# Patient Record
Sex: Male | Born: 1965 | Race: Black or African American | Hispanic: No | Marital: Single | Smoking: Never smoker
Health system: Southern US, Community
[De-identification: ages and names within clinical notes are randomized; demographics above are authoritative.]

## PROBLEM LIST (undated history)

## (undated) DIAGNOSIS — E119 Type 2 diabetes mellitus without complications: Secondary | ICD-10-CM

## (undated) DIAGNOSIS — I10 Essential (primary) hypertension: Secondary | ICD-10-CM

## (undated) DIAGNOSIS — M549 Dorsalgia, unspecified: Secondary | ICD-10-CM

## (undated) HISTORY — PX: ROTATOR CUFF REPAIR: SHX139

## (undated) HISTORY — PX: MANDIBLE FRACTURE SURGERY: SHX706

---

## 2001-07-14 ENCOUNTER — Emergency Department (HOSPITAL_COMMUNITY): Admission: EM | Admit: 2001-07-14 | Discharge: 2001-07-14 | Payer: Self-pay | Admitting: *Deleted

## 2001-07-14 ENCOUNTER — Encounter: Payer: Self-pay | Admitting: *Deleted

## 2001-09-05 ENCOUNTER — Emergency Department (HOSPITAL_COMMUNITY): Admission: EM | Admit: 2001-09-05 | Discharge: 2001-09-05 | Payer: Self-pay | Admitting: Emergency Medicine

## 2001-10-02 ENCOUNTER — Emergency Department (HOSPITAL_COMMUNITY): Admission: EM | Admit: 2001-10-02 | Discharge: 2001-10-02 | Payer: Self-pay

## 2001-10-10 ENCOUNTER — Emergency Department (HOSPITAL_COMMUNITY): Admission: EM | Admit: 2001-10-10 | Discharge: 2001-10-10 | Payer: Self-pay | Admitting: *Deleted

## 2002-06-05 ENCOUNTER — Emergency Department (HOSPITAL_COMMUNITY): Admission: EM | Admit: 2002-06-05 | Discharge: 2002-06-05 | Payer: Self-pay

## 2002-07-06 ENCOUNTER — Encounter: Payer: Self-pay | Admitting: Emergency Medicine

## 2002-07-06 ENCOUNTER — Emergency Department (HOSPITAL_COMMUNITY): Admission: EM | Admit: 2002-07-06 | Discharge: 2002-07-06 | Payer: Self-pay | Admitting: Emergency Medicine

## 2002-07-17 ENCOUNTER — Emergency Department (HOSPITAL_COMMUNITY): Admission: EM | Admit: 2002-07-17 | Discharge: 2002-07-17 | Payer: Self-pay | Admitting: Emergency Medicine

## 2002-07-17 ENCOUNTER — Encounter: Payer: Self-pay | Admitting: Emergency Medicine

## 2002-11-01 ENCOUNTER — Ambulatory Visit (HOSPITAL_COMMUNITY): Admission: RE | Admit: 2002-11-01 | Discharge: 2002-11-01 | Payer: Self-pay | Admitting: Internal Medicine

## 2002-11-01 ENCOUNTER — Encounter: Payer: Self-pay | Admitting: Internal Medicine

## 2002-11-23 ENCOUNTER — Emergency Department (HOSPITAL_COMMUNITY): Admission: EM | Admit: 2002-11-23 | Discharge: 2002-11-23 | Payer: Self-pay | Admitting: Emergency Medicine

## 2002-11-23 ENCOUNTER — Encounter: Payer: Self-pay | Admitting: Emergency Medicine

## 2003-03-09 ENCOUNTER — Emergency Department (HOSPITAL_COMMUNITY): Admission: EM | Admit: 2003-03-09 | Discharge: 2003-03-09 | Payer: Self-pay | Admitting: Emergency Medicine

## 2003-05-17 ENCOUNTER — Emergency Department (HOSPITAL_COMMUNITY): Admission: EM | Admit: 2003-05-17 | Discharge: 2003-05-18 | Payer: Self-pay | Admitting: Emergency Medicine

## 2003-05-24 ENCOUNTER — Ambulatory Visit (HOSPITAL_BASED_OUTPATIENT_CLINIC_OR_DEPARTMENT_OTHER): Admission: RE | Admit: 2003-05-24 | Discharge: 2003-05-24 | Payer: Self-pay | Admitting: Otolaryngology

## 2003-05-24 ENCOUNTER — Encounter (INDEPENDENT_AMBULATORY_CARE_PROVIDER_SITE_OTHER): Payer: Self-pay | Admitting: Specialist

## 2003-05-24 ENCOUNTER — Ambulatory Visit (HOSPITAL_COMMUNITY): Admission: RE | Admit: 2003-05-24 | Discharge: 2003-05-24 | Payer: Self-pay | Admitting: Otolaryngology

## 2003-11-27 ENCOUNTER — Emergency Department (HOSPITAL_COMMUNITY): Admission: EM | Admit: 2003-11-27 | Discharge: 2003-11-27 | Payer: Self-pay | Admitting: Emergency Medicine

## 2004-07-14 ENCOUNTER — Emergency Department (HOSPITAL_COMMUNITY): Admission: EM | Admit: 2004-07-14 | Discharge: 2004-07-14 | Payer: Self-pay | Admitting: Family Medicine

## 2005-06-05 ENCOUNTER — Emergency Department (HOSPITAL_COMMUNITY): Admission: EM | Admit: 2005-06-05 | Discharge: 2005-06-05 | Payer: Self-pay | Admitting: Family Medicine

## 2005-06-10 ENCOUNTER — Emergency Department (HOSPITAL_COMMUNITY): Admission: EM | Admit: 2005-06-10 | Discharge: 2005-06-10 | Payer: Self-pay | Admitting: Family Medicine

## 2005-09-30 ENCOUNTER — Emergency Department (HOSPITAL_COMMUNITY): Admission: EM | Admit: 2005-09-30 | Discharge: 2005-09-30 | Payer: Self-pay | Admitting: Family Medicine

## 2005-11-09 ENCOUNTER — Emergency Department (HOSPITAL_COMMUNITY): Admission: EM | Admit: 2005-11-09 | Discharge: 2005-11-10 | Payer: Self-pay | Admitting: Emergency Medicine

## 2005-11-18 ENCOUNTER — Emergency Department (HOSPITAL_COMMUNITY): Admission: EM | Admit: 2005-11-18 | Discharge: 2005-11-18 | Payer: Self-pay | Admitting: Emergency Medicine

## 2007-09-26 ENCOUNTER — Emergency Department (HOSPITAL_COMMUNITY): Admission: EM | Admit: 2007-09-26 | Discharge: 2007-09-26 | Payer: Self-pay | Admitting: Emergency Medicine

## 2007-10-11 ENCOUNTER — Emergency Department (HOSPITAL_COMMUNITY): Admission: EM | Admit: 2007-10-11 | Discharge: 2007-10-11 | Payer: Self-pay | Admitting: Emergency Medicine

## 2008-01-17 ENCOUNTER — Emergency Department (HOSPITAL_COMMUNITY): Admission: EM | Admit: 2008-01-17 | Discharge: 2008-01-17 | Payer: Self-pay | Admitting: Emergency Medicine

## 2008-10-12 ENCOUNTER — Encounter: Admission: RE | Admit: 2008-10-12 | Discharge: 2008-10-12 | Payer: Self-pay | Admitting: Occupational Medicine

## 2009-06-27 ENCOUNTER — Emergency Department (HOSPITAL_COMMUNITY): Admission: EM | Admit: 2009-06-27 | Discharge: 2009-06-27 | Payer: Self-pay | Admitting: Emergency Medicine

## 2010-01-07 ENCOUNTER — Emergency Department (HOSPITAL_COMMUNITY): Admission: EM | Admit: 2010-01-07 | Discharge: 2010-01-07 | Payer: Self-pay | Admitting: Family Medicine

## 2010-08-04 ENCOUNTER — Emergency Department (HOSPITAL_COMMUNITY)
Admission: EM | Admit: 2010-08-04 | Discharge: 2010-08-04 | Payer: Self-pay | Source: Home / Self Care | Admitting: Emergency Medicine

## 2010-08-07 ENCOUNTER — Other Ambulatory Visit: Payer: Self-pay | Admitting: Emergency Medicine

## 2010-08-07 ENCOUNTER — Inpatient Hospital Stay (INDEPENDENT_AMBULATORY_CARE_PROVIDER_SITE_OTHER)
Admission: RE | Admit: 2010-08-07 | Discharge: 2010-08-07 | Payer: Self-pay | Source: Ambulatory Visit | Attending: Emergency Medicine | Admitting: Emergency Medicine

## 2010-08-07 ENCOUNTER — Other Ambulatory Visit: Payer: Self-pay

## 2010-08-07 DIAGNOSIS — L03119 Cellulitis of unspecified part of limb: Secondary | ICD-10-CM

## 2010-08-09 LAB — CULTURE, ROUTINE-ABSCESS: Gram Stain: NONE SEEN

## 2010-10-22 IMAGING — CR DG WRIST COMPLETE 3+V*L*
4 series · 4 of 4 positions shown · non-contrast
Comparison: None.

CLINICAL DATA: 43-year-old male status post left wrist twisting
injury.  Lateral pain.

LEFT WRIST - COMPLETE 3+ VIEW

[view not recorded (1 of 4)]
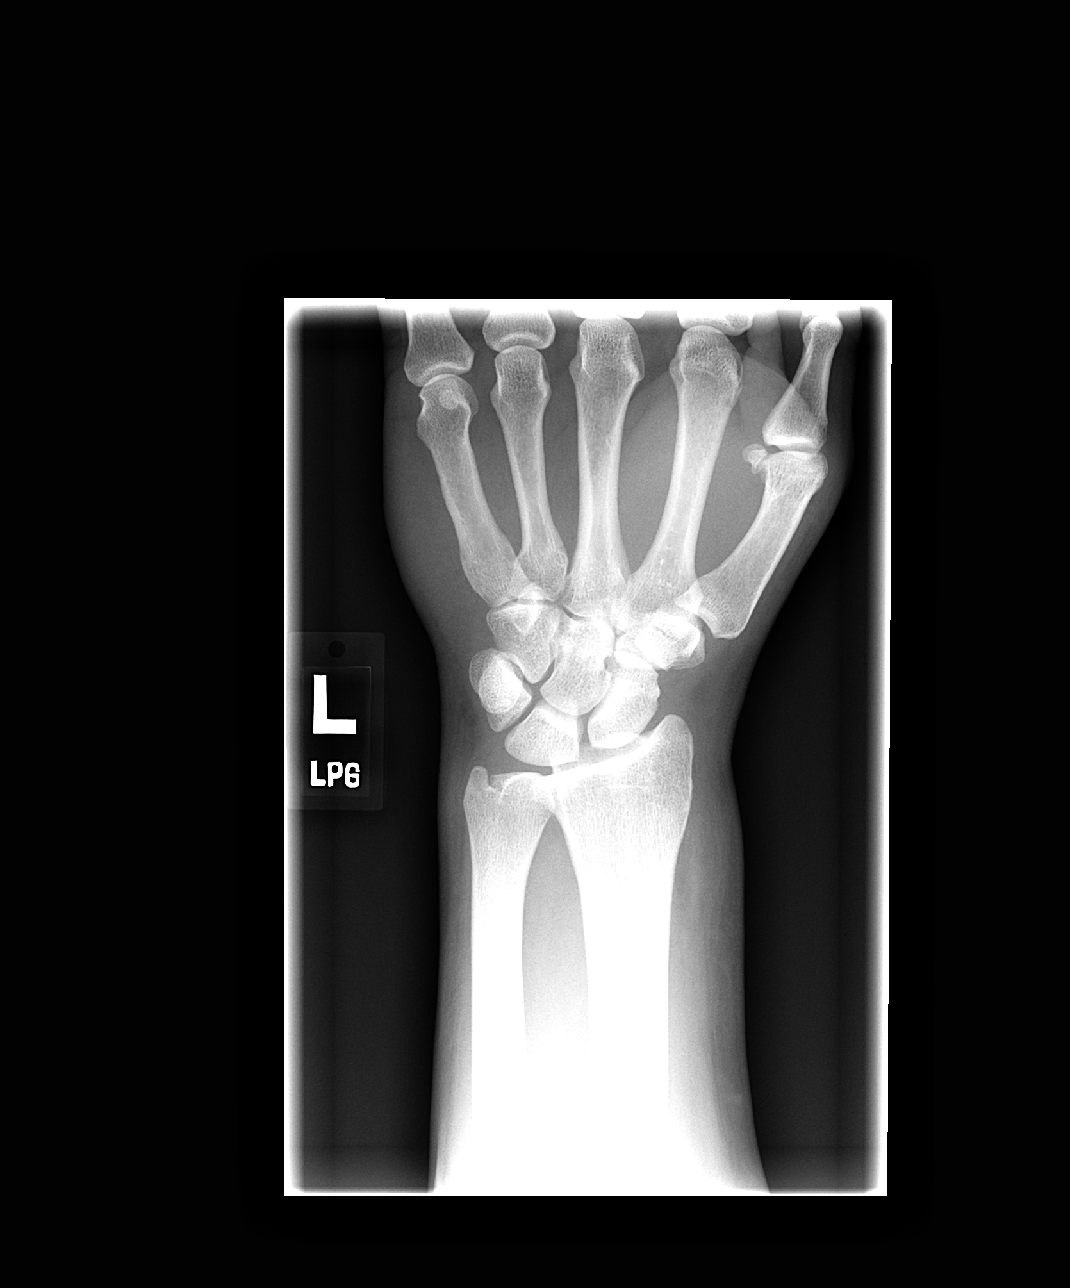

[view not recorded (2 of 4)]
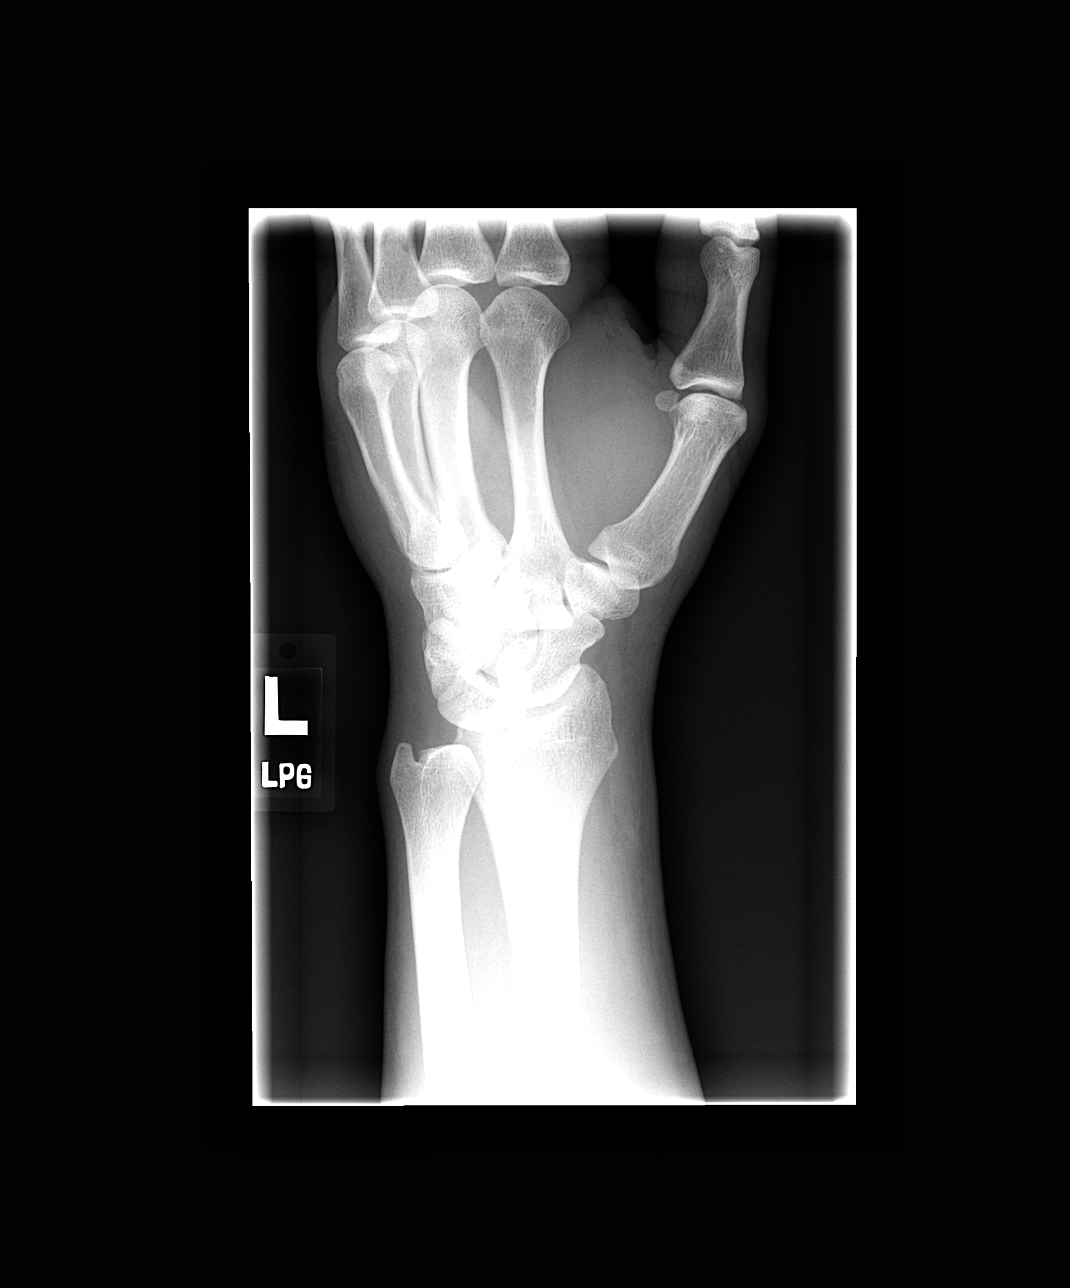

[view not recorded (3 of 4)]
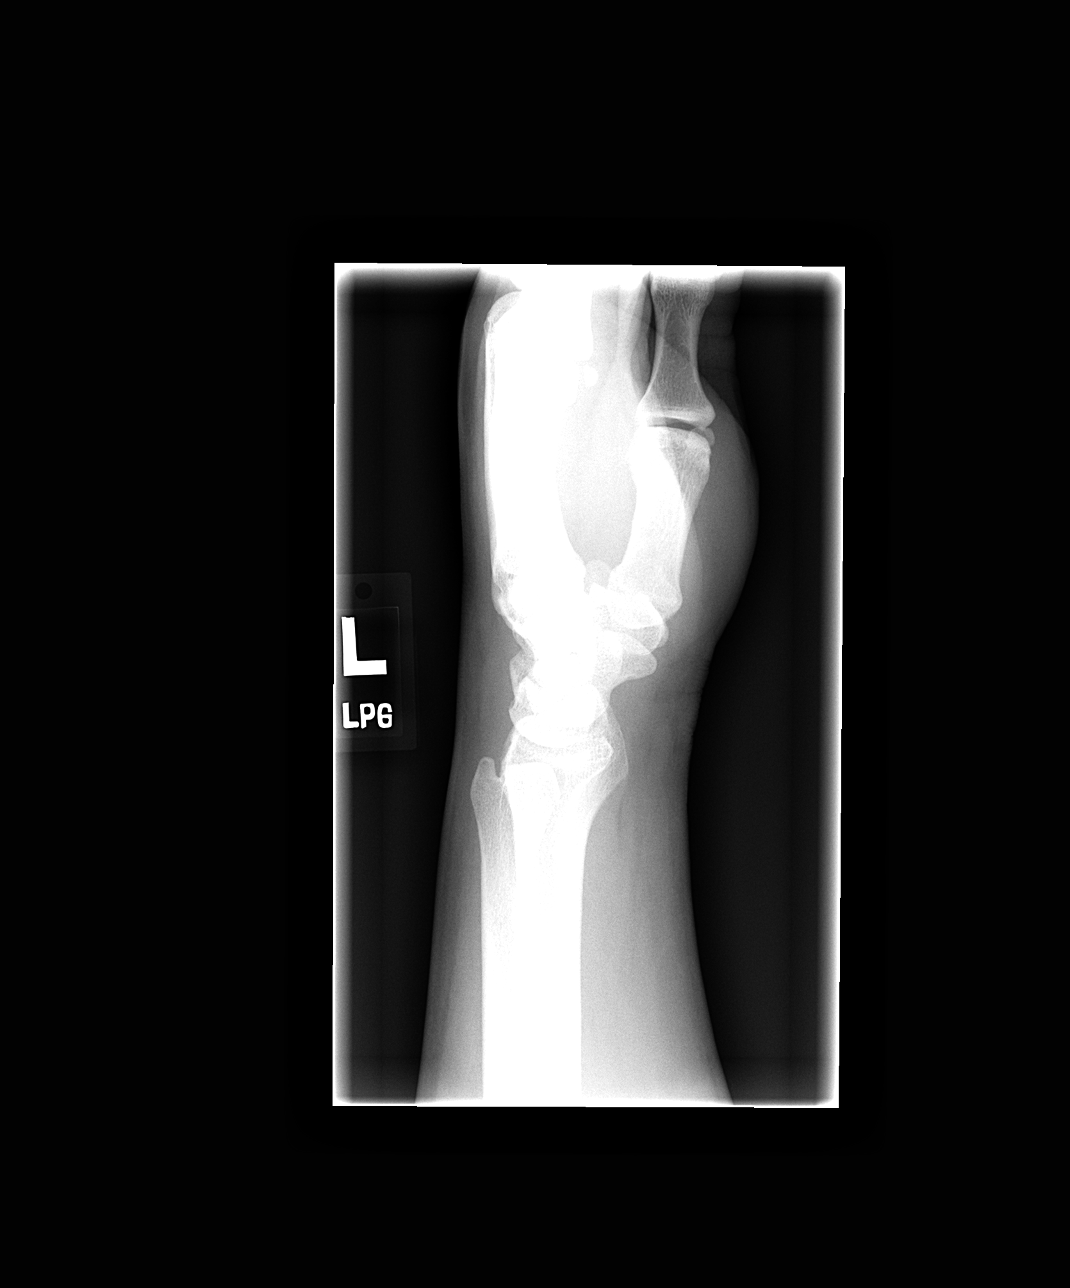

[view not recorded (4 of 4)]
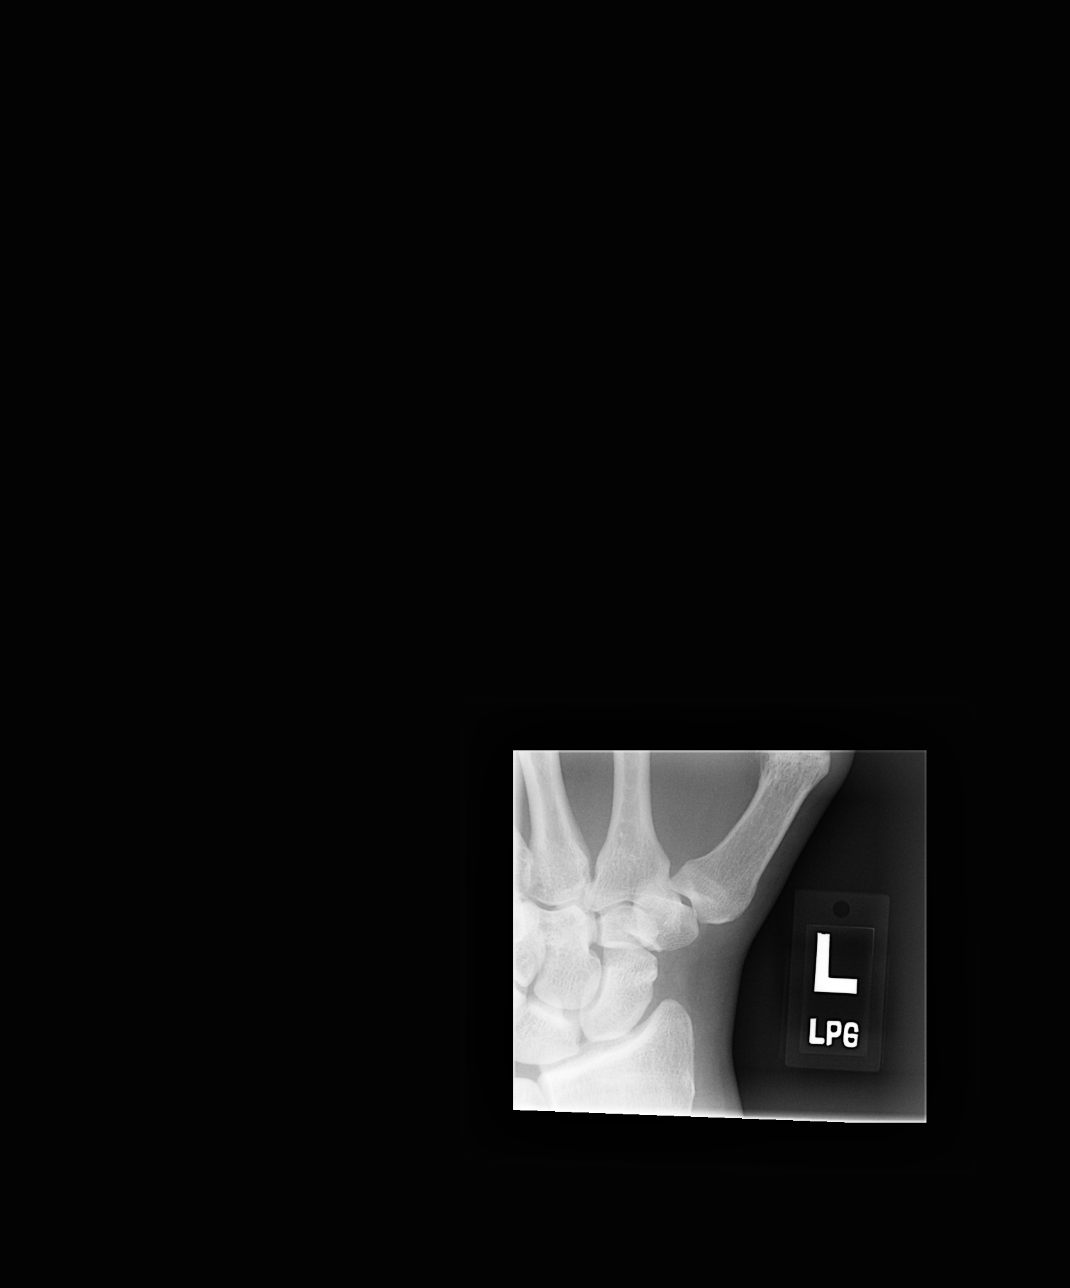

[4 of 4 positions shown; findings below may reference images not displayed]

FINDINGS: Bone mineralization is within normal limits.  Carpal bone
alignment within normal limits.  Distal left radius and ulna are
intact.  Joint spaces are within normal limits.  Scaphoid and other
carpal bones appear intact.  Visualized osseous structures of the
left hand appear intact.
IMPRESSION: No acute fracture or dislocation identified about the left wrist.

## 2010-11-21 NOTE — Op Note (Signed)
NAME:  CUTLER, SUNDAY                         ACCOUNT NO.:  [id number]   MEDICAL RECORD NO.:  [id number]                   PATIENT TYPE:  AMB   LOCATION:  DSC                                  FACILITY:  [facility]   PHYSICIAN:  Suzanna Obey, M.D.                    DATE OF BIRTH:  [birth year]   DATE OF PROCEDURE:  05/24/2003  DATE OF DISCHARGE:                                 OPERATIVE REPORT   PREOPERATIVE DIAGNOSIS:  Submental mass.   POSTOPERATIVE DIAGNOSIS:  Submental mass.   OPERATION PERFORMED:  Excision of submental mass.   SURGEON:  Suzanna Obey, M.D.   ANESTHESIA:  General endotracheal tube.   ESTIMATED BLOOD LOSS:  Less than 5 mL.   INDICATIONS FOR PROCEDURE:  This is a 45 year old who has had a mass in his  submental region for many months.  It has not gone away with antibiotic  therapy.  He feels like it is slightly enlarged.  He was informed of the  risks and benefits of the procedure including bleeding, infection, scar,  numbness of the area, risks of the facial nerve and risks of the anesthetic.  All questions were answered and consent was obtained.   DESCRIPTION OF PROCEDURE:  The patient was taken to the operating room and  placed in supine position.  After adequate LMA anesthesia, he was prepped  and draped in the usual sterile manner.  The incision was made with a 15  blade after injecting with 1% lidocaine with 1:100,000 epinephrine.  A skin  crease was followed.  The dissection was carried down with the  electrocautery and the mass was identified.  There were several nodules that  were dissected around with the electrocautery.  This was carried down to the  muscle, digastric mylohyoid area.  The mass was removed and hemostasis was  achieved with the electrocautery and clamping and dividing vessel.  The  wound was copiously irrigated.  The masses were sent for fresh pathology.  The wound was closed with interrupted 4-0 chromic and a running 5-0 nylon.  The patient  was awakened and brought to recovery in stable condition, counts  correct.                                               Suzanna Obey, M.D.    Cordelia Pen  D:  05/24/2003  T:  05/25/2003  Job:  [id number]   cc:   [facility]

## 2012-01-17 IMAGING — CR DG TOE GREAT 2+V*R*
3 series · 3 of 3 positions shown · non-contrast
Comparison: None

CLINICAL DATA: Injured right great toe.

RIGHT TOE - 2+ VIEW

[view not recorded (1 of 3)]
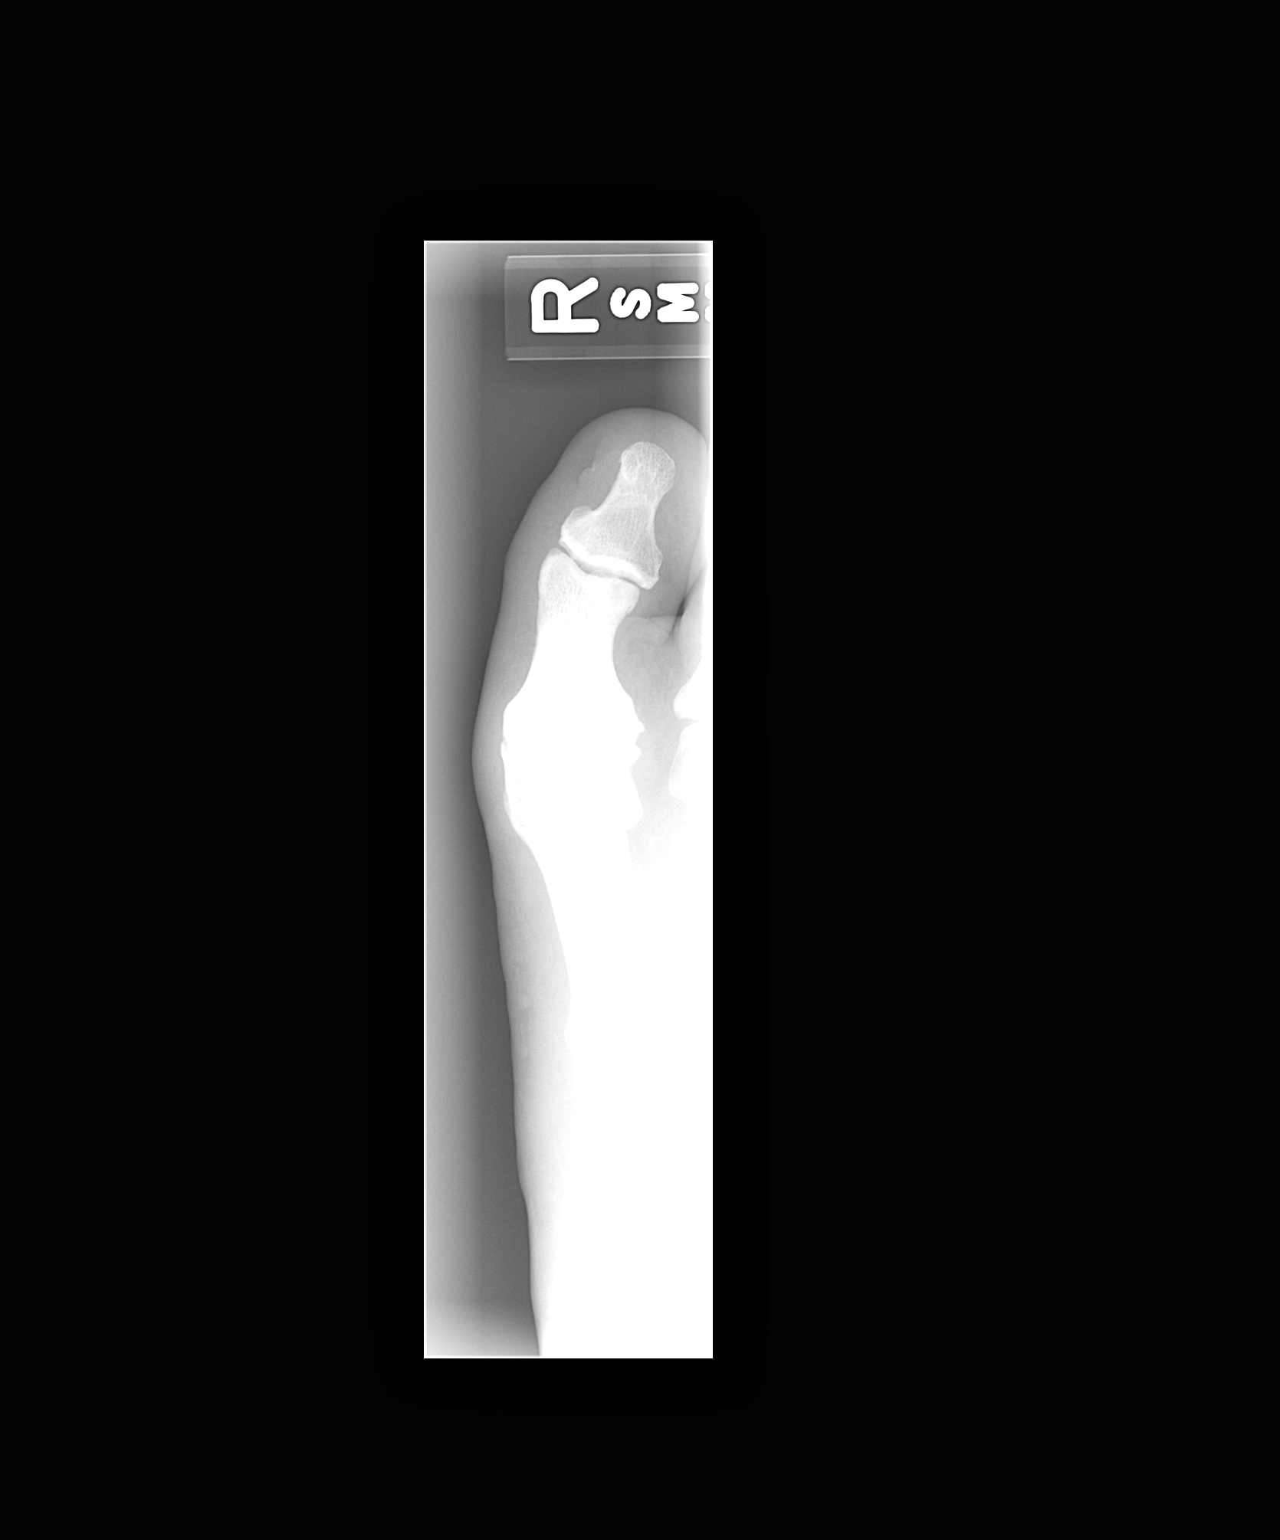

[view not recorded (2 of 3)]
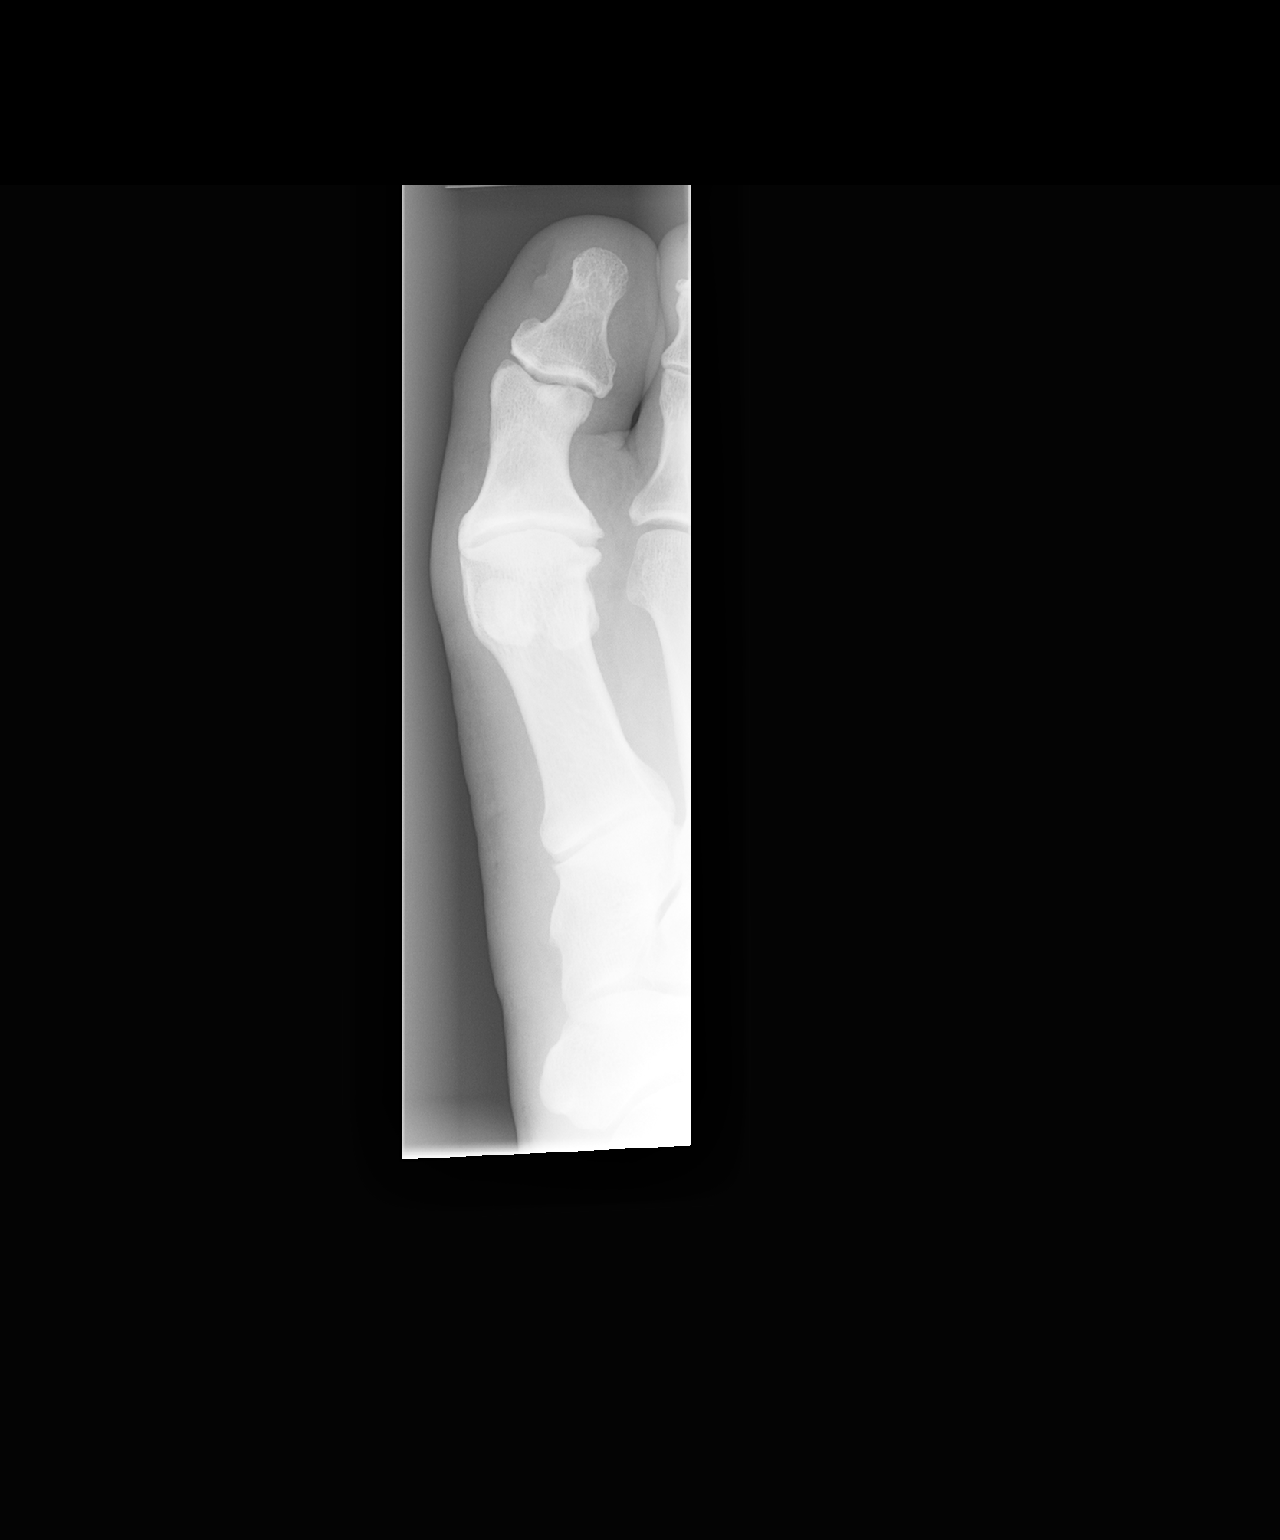

[view not recorded (3 of 3)]
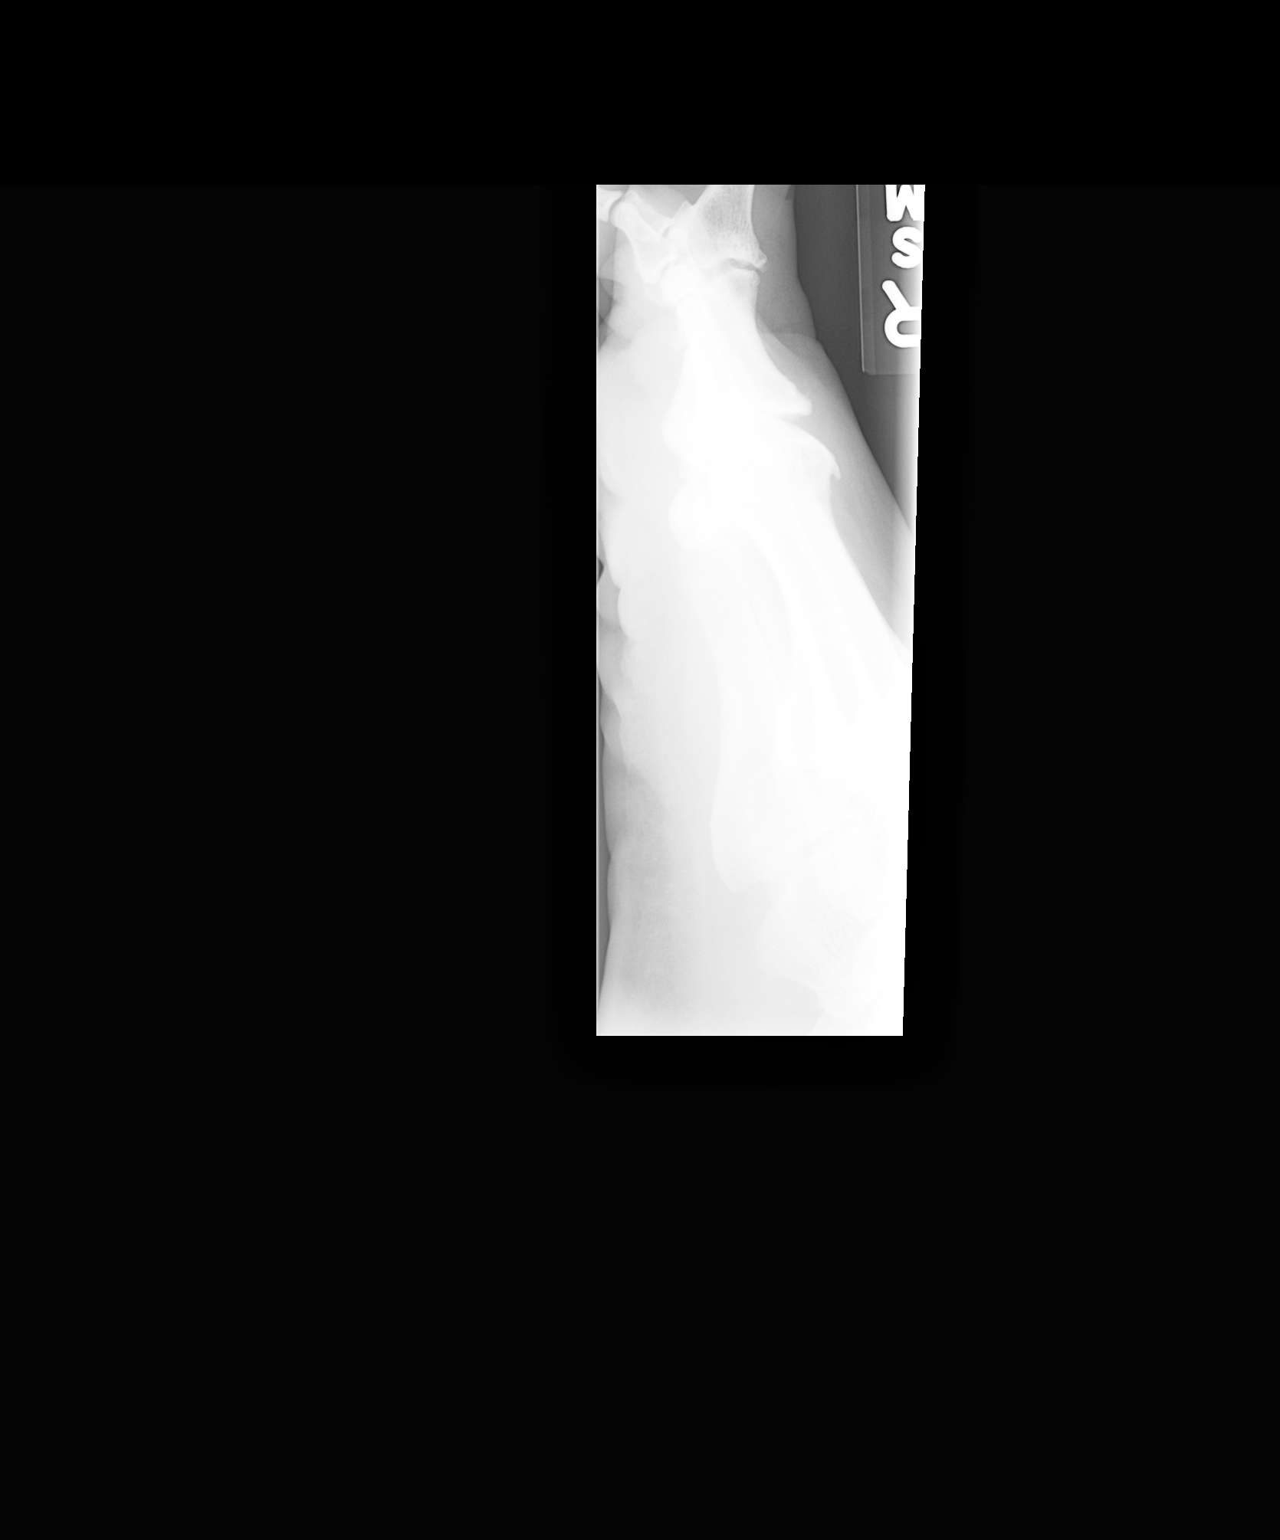

[3 of 3 positions shown; findings below may reference images not displayed]

FINDINGS: A hallux valgus deformity is noted.  Mild degenerative
changes at the metatarsal phalangeal joint.  No acute fracture.
IMPRESSION: No acute bony findings.

## 2014-02-07 ENCOUNTER — Encounter (HOSPITAL_COMMUNITY): Payer: Self-pay | Admitting: Emergency Medicine

## 2014-02-07 ENCOUNTER — Emergency Department (HOSPITAL_COMMUNITY)
Admission: EM | Admit: 2014-02-07 | Discharge: 2014-02-07 | Disposition: A | Discharge status: Home / Self Care | Payer: Self-pay | Attending: Emergency Medicine | Admitting: Emergency Medicine

## 2014-02-07 DIAGNOSIS — E119 Type 2 diabetes mellitus without complications: Secondary | ICD-10-CM | POA: Insufficient documentation

## 2014-02-07 DIAGNOSIS — I10 Essential (primary) hypertension: Secondary | ICD-10-CM | POA: Insufficient documentation

## 2014-02-07 DIAGNOSIS — G8929 Other chronic pain: Secondary | ICD-10-CM | POA: Insufficient documentation

## 2014-02-07 DIAGNOSIS — M5430 Sciatica, unspecified side: Secondary | ICD-10-CM | POA: Insufficient documentation

## 2014-02-07 DIAGNOSIS — M5431 Sciatica, right side: Secondary | ICD-10-CM

## 2014-02-07 DIAGNOSIS — M25559 Pain in unspecified hip: Secondary | ICD-10-CM | POA: Insufficient documentation

## 2014-02-07 HISTORY — DX: Essential (primary) hypertension: I10

## 2014-02-07 HISTORY — DX: Type 2 diabetes mellitus without complications: E11.9

## 2014-02-07 HISTORY — DX: Dorsalgia, unspecified: M54.9

## 2014-02-07 LAB — CBG MONITORING, ED: Glucose-Capillary: 314 mg/dL — ABNORMAL HIGH (ref 70–99)

## 2014-02-07 MED ORDER — KETOROLAC TROMETHAMINE 60 MG/2ML IM SOLN
60.0000 mg | Freq: Once | INTRAMUSCULAR | Status: AC
  Administered 2014-02-07: 60 mg via INTRAMUSCULAR
  Filled 2014-02-07: qty 2

## 2014-02-07 MED ORDER — PREDNISONE 20 MG PO TABS: 40.0000 mg | ORAL_TABLET | Freq: Every day | ORAL | Status: AC

## 2014-02-07 MED ORDER — CYCLOBENZAPRINE HCL 10 MG PO TABS: 10.0000 mg | ORAL_TABLET | Freq: Two times a day (BID) | ORAL | Status: AC | PRN

## 2014-02-07 NOTE — ED Notes (Signed)
Patient presents today with a chief complaint of right hip and buttock pain x 2 days, denies recent injury, reports history of chronic back pain.

## 2014-02-07 NOTE — Discharge Instructions (Signed)
Take Prednisone as directed until gone. Take Flexeril as needed for muscle pain. Follow up with your doctor for further evaluation and pain management. Refer to attached documents for more information.

## 2014-02-07 NOTE — ED Notes (Signed)
Per pt sts he woke up this am with right hip pain radiating down right leg. sts also recently went off insulin pump and using insulin. Did not take medication this am.

## 2014-02-07 NOTE — ED Provider Notes (Signed)
CSN: [id number]     Arrival date & time 02/07/14  1047 History  This chart was scribed Emilia Beck, PA-C, working with Merrie Roof,  MD, by Leona Carry, ED Scribe. The patient was seen in [facility]. The patient's care was started at 11:56 AM.    Chief Complaint  Patient presents with  . Hip Pain   Patient is a 48 y.o. male presenting with hip pain. The history is provided by the patient. No language interpreter was used.  Hip Pain   HPI Comments: Omar Thomas is a 48 y.o. male with a history of chronic back pain who presents to the Emergency Department complaining of lower back pain that radiates down his right leg beginning two days ago. Patient reports that the pain become more severe after taking a shower this morning. He denies any recent injury or trauma, numbness, or weakness.   Patient reports that he was given an epidural in February 2015 by Dr. Thelma Barge, pain management, at [facility]. He does not have a PCP.   Past Medical History  Diagnosis Date  . Back pain   . Hypertension   . Diabetes mellitus without complication    Past Surgical History  Procedure Laterality Date  . Mandible fracture surgery    . Rotator cuff repair     No family history on file. History  Substance Use Topics  . Smoking status: Never Smoker   . Smokeless tobacco: Not on file  . Alcohol Use: No    Review of Systems  Genitourinary:       Denies incontinence.  Musculoskeletal: Positive for back pain.  Neurological: Negative for weakness and numbness.  All other systems reviewed and are negative.     Allergies  Review of patient's allergies indicates no known allergies.  Home Medications   Prior to Admission medications   Not on File   Triage Vitals: BP 124/83  Pulse 72  Temp(Src) 98 F (36.7 C) (Oral)  Resp 20  Ht 5\' 6"  (1.676 m)  Wt 189 lb (85.73 kg)  BMI 30.52 kg/m2  SpO2 100% Physical Exam  Nursing note and vitals  reviewed. Constitutional: He appears well-developed and well-nourished. No distress.  HENT:  Head: Normocephalic and atraumatic.  Eyes: Conjunctivae are normal.  Cardiovascular: Normal rate and regular rhythm.  Exam reveals no gallop and no friction rub.   No murmur heard. Pulmonary/Chest: Effort normal and breath sounds normal. He has no wheezes. He has no rales. He exhibits no tenderness.  Abdominal: Soft. There is no tenderness.  Musculoskeletal: Normal range of motion.  Neurological: He is alert.  Speech is goal-oriented. Moves limbs without ataxia.   Skin: Skin is warm and dry.    ED Course  Procedures (including critical care time) DIAGNOSTIC STUDIES: Oxygen Saturation is 100% on room air, normal by my interpretation.    COORDINATION OF CARE: 12:00 PM-Discussed treatment plan which includes Toradol, Deltasone, and Flexeril with pt at bedside and pt agreed to plan.     Labs Review Labs Reviewed - No data to display  Imaging Review No results found.   EKG Interpretation None      MDM   Final diagnoses:  Sciatica, right    Patient likely has sciatica on the right. Patient will have flexeril and prednisone for symptoms. No bladder/bowel incontinence or saddle paresthesias. No injury to indicate imaging at this time. Vitals stable and patient afebrile.   I personally performed the services described in this documentation,  which was scribed in my presence. The recorded information has been reviewed and is accurate.    Emilia BeckKaitlyn Bernadette Armijo, PA-C 02/07/14 1353

## 2014-02-07 NOTE — ED Provider Notes (Signed)
Medical screening examination/treatment/procedure(s) were performed by non-physician practitioner and as supervising physician I was immediately available for consultation/collaboration.   Tami Barren David Dajah Fischman III, MD 02/07/14 2206 

## 2021-08-29 ENCOUNTER — Other Ambulatory Visit: Payer: Self-pay | Admitting: Neurosurgery

## 2021-08-29 DIAGNOSIS — M5416 Radiculopathy, lumbar region: Secondary | ICD-10-CM

## 2021-09-23 ENCOUNTER — Other Ambulatory Visit: Payer: Self-pay
